# Patient Record
Sex: Male | Born: 1995 | Race: White | Hispanic: No | Marital: Single | Smoking: Current every day smoker
Health system: Southern US, Community
[De-identification: ages and names within clinical notes are randomized; demographics above are authoritative.]

## PROBLEM LIST (undated history)

## (undated) DIAGNOSIS — K589 Irritable bowel syndrome without diarrhea: Secondary | ICD-10-CM

## (undated) DIAGNOSIS — J45909 Unspecified asthma, uncomplicated: Secondary | ICD-10-CM

## (undated) HISTORY — PX: OTHER SURGICAL HISTORY: SHX169

---
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
## 2020-08-19 ENCOUNTER — Encounter: Payer: Self-pay | Admitting: Family Medicine

## 2020-08-19 ENCOUNTER — Telehealth (INDEPENDENT_AMBULATORY_CARE_PROVIDER_SITE_OTHER): Payer: Self-pay | Admitting: Family Medicine

## 2020-08-19 VITALS — Ht 70.0 in | Wt 170.0 lb

## 2020-08-19 DIAGNOSIS — R05 Cough: Secondary | ICD-10-CM

## 2020-08-19 DIAGNOSIS — R059 Cough, unspecified: Secondary | ICD-10-CM | POA: Insufficient documentation

## 2020-08-19 NOTE — Progress Notes (Signed)
Virtual Visit via Telephone Note  I connected with Scott Spencer on 08/19/20 at  1:30 PM EDT by telephone and verified that I am speaking with the correct person using two identifiers.DOB/ new address [address], [place]-formerly lives in [place]  Location: Patient: at home-pt has moved to [place] Provider: in clinic   I discussed the limitations, risks, security and privacy concerns of performing an evaluation and management service by telephone and the availability of in person appointments. I also discussed with the patient that there may be a patient responsible charge related to this service. The patient expressed understanding and agreed to proceed.   History of Present Illness: pt with cough, fever, s/t and headache over the past 3 days. Pt with nausea, no vomiting and diarrhea. Pt states he has body aches but has not lost his sense of smell or taste. Pt with SOB with exertion. Pt states no rash. NO COVID exposure. Pt has NOT been vaccinated. Pt works in a bar and at [employer]   Review of Systems  Constitutional: Positive for fever and malaise/fatigue.  HENT: Positive for congestion.   Respiratory: Positive for cough and shortness of breath.   Gastrointestinal: Positive for diarrhea.  Musculoskeletal: Positive for myalgias.  Neurological: Positive for headaches.    Assessment and Plan:  1. 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 Pt states no improvement in symptoms today. Pt with no h/o asthma. Pt does take medication for allergies prn. Pt took otc sinus medication Follow Up Instructions: COVID testing in local area to pts new home-fluids, tylenol for elevated temp.   I discussed the assessment and treatment plan with the patient. The patient was provided an opportunity to ask questions and all were answered. The patient agreed with the plan and  demonstrated an understanding of the instructions.   The patient was advised to call back if the symptoms worsen or if the condition fails to improve as anticipated.   I provided 9 minutes of non-face-to-face time during this encounter.   Obrian Bulson Mat Carne, MD
